# Patient Record
Sex: Female | Born: 1950 | Race: White | Hispanic: No | Smoking: Former smoker
Health system: Southern US, Community
[De-identification: ages and names within clinical notes are randomized; demographics above are authoritative.]

## PROBLEM LIST (undated history)

## (undated) DIAGNOSIS — M810 Age-related osteoporosis without current pathological fracture: Secondary | ICD-10-CM

## (undated) DIAGNOSIS — Z87898 Personal history of other specified conditions: Secondary | ICD-10-CM

## (undated) DIAGNOSIS — F419 Anxiety disorder, unspecified: Secondary | ICD-10-CM

## (undated) DIAGNOSIS — R7989 Other specified abnormal findings of blood chemistry: Secondary | ICD-10-CM

## (undated) DIAGNOSIS — J449 Chronic obstructive pulmonary disease, unspecified: Secondary | ICD-10-CM

## (undated) DIAGNOSIS — B1920 Unspecified viral hepatitis C without hepatic coma: Secondary | ICD-10-CM

## (undated) DIAGNOSIS — R7303 Prediabetes: Secondary | ICD-10-CM

## (undated) DIAGNOSIS — J45909 Unspecified asthma, uncomplicated: Secondary | ICD-10-CM

## (undated) DIAGNOSIS — I6529 Occlusion and stenosis of unspecified carotid artery: Secondary | ICD-10-CM

## (undated) DIAGNOSIS — I2510 Atherosclerotic heart disease of native coronary artery without angina pectoris: Secondary | ICD-10-CM

## (undated) DIAGNOSIS — Z972 Presence of dental prosthetic device (complete) (partial): Secondary | ICD-10-CM

## (undated) DIAGNOSIS — F329 Major depressive disorder, single episode, unspecified: Secondary | ICD-10-CM

## (undated) DIAGNOSIS — Z973 Presence of spectacles and contact lenses: Secondary | ICD-10-CM

## (undated) DIAGNOSIS — E039 Hypothyroidism, unspecified: Secondary | ICD-10-CM

## (undated) DIAGNOSIS — E785 Hyperlipidemia, unspecified: Secondary | ICD-10-CM

## (undated) DIAGNOSIS — M5030 Other cervical disc degeneration, unspecified cervical region: Secondary | ICD-10-CM

## (undated) DIAGNOSIS — I10 Essential (primary) hypertension: Secondary | ICD-10-CM

## (undated) DIAGNOSIS — Z8669 Personal history of other diseases of the nervous system and sense organs: Secondary | ICD-10-CM

## (undated) DIAGNOSIS — F32A Depression, unspecified: Secondary | ICD-10-CM

## (undated) DIAGNOSIS — E539 Vitamin B deficiency, unspecified: Secondary | ICD-10-CM

## (undated) DIAGNOSIS — E079 Disorder of thyroid, unspecified: Secondary | ICD-10-CM

## (undated) DIAGNOSIS — Z8719 Personal history of other diseases of the digestive system: Secondary | ICD-10-CM

## (undated) DIAGNOSIS — R011 Cardiac murmur, unspecified: Secondary | ICD-10-CM

## (undated) DIAGNOSIS — N393 Stress incontinence (female) (male): Secondary | ICD-10-CM

## (undated) HISTORY — PX: UPPER GI ENDOSCOPY: SHX6162

## (undated) HISTORY — PX: CERVICAL FUSION: SHX112

## (undated) HISTORY — PX: OTHER SURGICAL HISTORY: SHX169

## (undated) HISTORY — PX: WISDOM TOOTH EXTRACTION: SHX21

## (undated) HISTORY — PX: TONSILLECTOMY: SUR1361

## (undated) HISTORY — PX: APPENDECTOMY: SHX54

## (undated) HISTORY — PX: ABDOMINAL HYSTERECTOMY: SHX81

## (undated) HISTORY — PX: INCONTINENCE SURGERY: SHX676

## (undated) HISTORY — PX: HERNIA REPAIR: SHX51

## (undated) HISTORY — PX: CHOLECYSTECTOMY: SHX55

## (undated) HISTORY — PX: LAPAROSCOPIC GASTRIC BANDING: SHX1100

## (undated) HISTORY — PX: FOOT SURGERY: SHX648

## (undated) HISTORY — PX: CATARACT EXTRACTION: SUR2

## (undated) HISTORY — PX: COLONOSCOPY: SHX174

## (undated) HISTORY — PX: ANKLE FRACTURE SURGERY: SHX122

## (undated) HISTORY — PX: CERVICAL SPINE SURGERY: SHX589

---
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
## 2012-07-05 DIAGNOSIS — J189 Pneumonia, unspecified organism: Secondary | ICD-10-CM

## 2012-07-05 HISTORY — DX: Pneumonia, unspecified organism: J18.9

## 2017-12-25 ENCOUNTER — Emergency Department (HOSPITAL_BASED_OUTPATIENT_CLINIC_OR_DEPARTMENT_OTHER): Payer: BLUE CROSS/BLUE SHIELD

## 2017-12-25 ENCOUNTER — Encounter (HOSPITAL_BASED_OUTPATIENT_CLINIC_OR_DEPARTMENT_OTHER): Payer: Self-pay

## 2017-12-25 ENCOUNTER — Emergency Department (HOSPITAL_BASED_OUTPATIENT_CLINIC_OR_DEPARTMENT_OTHER)
Admission: EM | Admit: 2017-12-25 | Discharge: 2017-12-25 | Disposition: A | Discharge status: Home / Self Care | Payer: BLUE CROSS/BLUE SHIELD | Attending: Emergency Medicine | Admitting: Emergency Medicine

## 2017-12-25 ENCOUNTER — Other Ambulatory Visit: Payer: Self-pay

## 2017-12-25 DIAGNOSIS — I2510 Atherosclerotic heart disease of native coronary artery without angina pectoris: Secondary | ICD-10-CM | POA: Diagnosis not present

## 2017-12-25 DIAGNOSIS — R011 Cardiac murmur, unspecified: Secondary | ICD-10-CM

## 2017-12-25 DIAGNOSIS — I10 Essential (primary) hypertension: Secondary | ICD-10-CM | POA: Insufficient documentation

## 2017-12-25 DIAGNOSIS — F17200 Nicotine dependence, unspecified, uncomplicated: Secondary | ICD-10-CM | POA: Diagnosis not present

## 2017-12-25 DIAGNOSIS — M7989 Other specified soft tissue disorders: Secondary | ICD-10-CM

## 2017-12-25 DIAGNOSIS — R600 Localized edema: Secondary | ICD-10-CM | POA: Insufficient documentation

## 2017-12-25 DIAGNOSIS — R2241 Localized swelling, mass and lump, right lower limb: Secondary | ICD-10-CM | POA: Diagnosis present

## 2017-12-25 HISTORY — DX: Atherosclerotic heart disease of native coronary artery without angina pectoris: I25.10

## 2017-12-25 HISTORY — DX: Other specified abnormal findings of blood chemistry: R79.89

## 2017-12-25 HISTORY — DX: Vitamin B deficiency, unspecified: E53.9

## 2017-12-25 HISTORY — DX: Unspecified viral hepatitis C without hepatic coma: B19.20

## 2017-12-25 HISTORY — DX: Essential (primary) hypertension: I10

## 2017-12-25 HISTORY — DX: Disorder of thyroid, unspecified: E07.9

## 2017-12-25 LAB — CBC WITH DIFFERENTIAL/PLATELET
Basophils Absolute: 0 10*3/uL (ref 0.0–0.1)
Basophils Relative: 0 %
EOS ABS: 0.2 10*3/uL (ref 0.0–0.7)
Eosinophils Relative: 2 %
HEMATOCRIT: 41.9 % (ref 36.0–46.0)
Hemoglobin: 13.9 g/dL (ref 12.0–15.0)
LYMPHS ABS: 3.8 10*3/uL (ref 0.7–4.0)
LYMPHS PCT: 38 %
MCH: 31.2 pg (ref 26.0–34.0)
MCHC: 33.2 g/dL (ref 30.0–36.0)
MCV: 93.9 fL (ref 78.0–100.0)
Monocytes Absolute: 1 10*3/uL (ref 0.1–1.0)
Monocytes Relative: 10 %
Neutro Abs: 4.9 10*3/uL (ref 1.7–7.7)
Neutrophils Relative %: 50 %
Platelets: 356 10*3/uL (ref 150–400)
RBC: 4.46 MIL/uL (ref 3.87–5.11)
RDW: 13.2 % (ref 11.5–15.5)
WBC: 9.9 10*3/uL (ref 4.0–10.5)

## 2017-12-25 LAB — COMPREHENSIVE METABOLIC PANEL
ALT: 16 U/L (ref 14–54)
AST: 20 U/L (ref 15–41)
Albumin: 4 g/dL (ref 3.5–5.0)
Alkaline Phosphatase: 71 U/L (ref 38–126)
Anion gap: 8 (ref 5–15)
BILIRUBIN TOTAL: 0.3 mg/dL (ref 0.3–1.2)
BUN: 16 mg/dL (ref 6–20)
CALCIUM: 9.3 mg/dL (ref 8.9–10.3)
CO2: 27 mmol/L (ref 22–32)
Chloride: 106 mmol/L (ref 101–111)
Creatinine, Ser: 0.82 mg/dL (ref 0.44–1.00)
GFR calc non Af Amer: >60 mL/min (ref >60)
GLUCOSE: 89 mg/dL (ref 65–99)
Potassium: 3.6 mmol/L (ref 3.5–5.1)
SODIUM: 141 mmol/L (ref 135–145)
Total Protein: 7.4 g/dL (ref 6.5–8.1)

## 2017-12-25 LAB — BRAIN NATRIURETIC PEPTIDE: B Natriuretic Peptide: 44.2 pg/mL (ref 0.0–100.0)

## 2017-12-25 LAB — TROPONIN I

## 2017-12-25 MED ORDER — VARENICLINE TARTRATE 0.5 MG PO TABS: ORAL_TABLET | ORAL | 0 refills | Status: DC

## 2017-12-25 NOTE — ED Triage Notes (Addendum)
Pt reports right leg swelling for 2-3 days. Pt reports yesterday she started developing bruising and pain. Pt denies any injury. Pt reports about 1 year ago she broke her right ankle and had to have surgery. Pt reports she has been weak in that leg since then.

## 2017-12-25 NOTE — ED Notes (Signed)
ED Provider at bedside. 

## 2017-12-25 NOTE — ED Provider Notes (Signed)
Emergency Department Provider Note   I have reviewed the triage vital signs and the nursing notes.   HISTORY  Chief Complaint Leg Swelling (right leg )   HPI Kirsten Patrick is a 67 y.o. female with history of coronary artery disease diagnosed on the CT scan, chronic hepatitis C, thyroid disease, low vitamin D and vitamin B and hypertension on medication presents to the emergency department today with atraumatic right leg swelling.  Patient states she has had a surgery there in the past however over the last couple days she has had swelling and discomfort in that leg along with abnormal bruising without any trauma.  States she never anything like that before on further review of systems she also states that she is had episodic lightheadedness and palpitations.  States compliance her blood pressure but it goes up and down.  No urinary symptoms.  No recent illnesses.  Is meeting up with her doctor on Friday to get a referral for the coronary artery disease that was seen on CT scan was never had a heart attack.  No history of blood clots. No other associated or modifying symptoms.    Past Medical History:  Diagnosis Date  . Coronary artery disease   . Hepatitis C    chronic  . Hypertension   . Low vitamin D level   . Thyroid disease   . Vitamin B deficiency     There are no active problems to display for this patient.   Past Surgical History:  Procedure Laterality Date  . ABDOMINAL HYSTERECTOMY    . ANKLE FRACTURE SURGERY    . APPENDECTOMY    . CERVICAL SPINE SURGERY    . CHOLECYSTECTOMY    . gastric band    . TONSILLECTOMY        Allergies Rocephin [ceftriaxone]  No family history on file.  Social History Social History   Tobacco Use  . Smoking status: Current Every Day Smoker  . Smokeless tobacco: Never Used  Substance Use Topics  . Alcohol use: Never    Frequency: Never  . Drug use: Never    Review of Systems  All other systems negative except as  documented in the HPI. All pertinent positives and negatives as reviewed in the HPI. ____________________________________________   PHYSICAL EXAM:  VITAL SIGNS: ED Triage Vitals  Enc Vitals Group     BP 12/25/17 1624 (!) 184/95     Pulse Rate 12/25/17 1624 86     Resp 12/25/17 1624 20     Temp 12/25/17 1624 99.2 F (37.3 C)     Temp Source 12/25/17 1624 Oral     SpO2 --      Weight 12/25/17 1626 187 lb (84.8 kg)     Height 12/25/17 1626 5\' 4"  (1.626 m)    Constitutional: Alert and oriented. Well appearing and in no acute distress. Eyes: Conjunctivae are normal. PERRL. EOMI. Head: Atraumatic. Nose: No congestion/rhinnorhea. Mouth/Throat: Mucous membranes are moist.  Oropharynx non-erythematous. Neck: No stridor.  No meningeal signs.   Cardiovascular: Normal rate, regular rhythm. Good peripheral circulation. Grossly normal heart sounds.   Respiratory: Normal respiratory effort.  No retractions. Lungs CTAB. Gastrointestinal: Soft and nontender. No distention.  Musculoskeletal: No lower extremity tenderness but has R>L edema (1+ pitting on right) with few areas of ecchymosis and one spot of spider angiomata. No gross deformities of extremities. Neurologic:  Normal speech and language. No gross focal neurologic deficits are appreciated.  Skin:  Skin is warm, dry and  intact. No rash noted.   ____________________________________________   LABS (all labs ordered are listed, but only abnormal results are displayed)  Labs Reviewed  CBC WITH DIFFERENTIAL/PLATELET  COMPREHENSIVE METABOLIC PANEL  BRAIN NATRIURETIC PEPTIDE  TROPONIN I  METANEPHRINES, PLASMA   ____________________________________________  EKG   EKG Interpretation  Date/Time:  Sunday December 25 2017 16:42:54 EDT Ventricular Rate:  74 PR Interval:    QRS Duration: 107 QT Interval:  379 QTC Calculation: 421 R Axis:   59 Text Interpretation:  Sinus rhythm Low voltage, precordial leads No old tracing to compare  Confirmed by Marily MemosMesner, Anan Dapolito [phone]([id number]) on 12/25/2017 5:04:03 PM       ____________________________________________  RADIOLOGY  Dg Chest 2 View  Result Date: 12/25/2017 CLINICAL DATA:  Right leg swelling.  Smoker. EXAM: CHEST - 2 VIEW COMPARISON:  None. FINDINGS: Normal sized heart. Clear lungs. The lungs are mildly hyperexpanded with mild diffuse peribronchial thickening and accentuation of the interstitial markings. A gastric band is in place. Cervical spine fixation hardware. Right neck surgical clips. Thoracic spine degenerative changes and mild scoliosis. IMPRESSION: No acute abnormality.  Mild changes of COPD and chronic bronchitis. Electronically Signed   By: Beckie SaltsSteven  Reid M.D.   On: 12/25/2017 18:14   [place] Venous Img Lower Unilateral Right  Result Date: 12/25/2017 CLINICAL DATA:  Right leg swelling for the past 2-3 days with pain and spontaneous bruising for 1 day. EXAM: RIGHT LOWER EXTREMITY VENOUS DOPPLER ULTRASOUND TECHNIQUE: Gray-scale sonography with graded compression, as well as color Doppler and duplex ultrasound were performed to evaluate the lower extremity deep venous systems from the level of the common femoral vein and including the common femoral, femoral, profunda femoral, popliteal and calf veins including the posterior tibial, peroneal and gastrocnemius veins when visible. The superficial great saphenous vein was also interrogated. Spectral Doppler was utilized to evaluate flow at rest and with distal augmentation maneuvers in the common femoral, femoral and popliteal veins. COMPARISON:  None. FINDINGS: Contralateral Common Femoral Vein: Respiratory phasicity is normal and symmetric with the symptomatic side. No evidence of thrombus. Normal compressibility. Common Femoral Vein: No evidence of thrombus. Normal compressibility, respiratory phasicity and response to augmentation. Saphenofemoral Junction: No evidence of thrombus. Normal compressibility and flow on color Doppler imaging.  Profunda Femoral Vein: No evidence of thrombus. Normal compressibility and flow on color Doppler imaging. Femoral Vein: No evidence of thrombus. Normal compressibility, respiratory phasicity and response to augmentation. Popliteal Vein: No evidence of thrombus. Normal compressibility, respiratory phasicity and response to augmentation. Calf Veins: No evidence of thrombus. Normal compressibility and flow on color Doppler imaging. Superficial Great Saphenous Vein: No evidence of thrombus. Normal compressibility. Venous Reflux:  None. Other Findings:  None. IMPRESSION: No evidence of deep venous thrombosis. Electronically Signed   By: Beckie SaltsSteven  Reid M.D.   On: 12/25/2017 18:15    ____________________________________________   PROCEDURES  Procedure(s) performed:   Procedures   Counseled patient for approximately 9 minutes regarding smoking cessation. Discussed risks of smoking and how they applied and affected their visit here today. Patient ready to quit smoking and elected to try Chantix. Rx provided. Will fu w/ PCP for further prescriptions and monitoring/counseling.   CPT code: [id number]: intermediate counseling for smoking cessation  ____________________________________________   INITIAL IMPRESSION / ASSESSMENT AND PLAN / ED COURSE  Has a heart murmur that she is never heard about before and lower extremity swelling along with hypertension.  Concern for possible heart failure as a cause of her symptoms.  Also could have  pheochromocytoma she has a known adrenal cyst of some sort and is episodic palpitations and syncope.  We will also evaluate for DVT.  Workup negative. Will dc to follow up with PCP for further workup. Also with pcp for murmur and likely need for echo.      Pertinent labs & imaging results that were available during my care of the patient were reviewed by me and considered in my medical decision making (see chart for  details).  ____________________________________________  FINAL CLINICAL IMPRESSION(S) / ED DIAGNOSES  Final diagnoses:  Right leg swelling  Ready to quit smoking  Heart murmur  Hypertension, unspecified type     MEDICATIONS GIVEN DURING THIS VISIT:  Medications - No data to display   NEW OUTPATIENT MEDICATIONS STARTED DURING THIS VISIT:  Discharge Medication List as of 12/25/2017  7:11 PM    START taking these medications   Details  varenicline (CHANTIX) 0.5 MG tablet Please take 1 0.5 mg tablet once daily on days 1 through 3. Please take 1 0.5 mg tablet twice daily on days 4 through 7. After day 7 take 2 0.5 mg tablets twice daily until finished with treatment. Follow-up with your primary doctor to get a prescript ion for continuing treatment., Print        Note:  This note was prepared with assistance of Dragon voice recognition software. Occasional wrong-word or sound-a-like substitutions may have occurred due to the inherent limitations of voice recognition software.   Marily Memos, MD 12/25/17 2231

## 2017-12-25 NOTE — Discharge Instructions (Addendum)
Your blood pressure was significantly elevated on arrival here today.  I know that you take medications at home we will suggest that you check your blood pressures 2 or 3 times a day over the next week.  Do not obsess or worry over the numbers you get but take them into your doctor on Friday and see if you need any blood pressure adjustments.  Try to take them randomly throughout the day so that your doctor can have a good picture of what your blood pressure does throughout the day to help with treatment.  I have given you a prescription for Chantix.  I have included the directions that are suggested however there may be an alternative regimen so if pharmacy suggested a different regimen please utilize that.

## 2017-12-25 NOTE — ED Notes (Signed)
Patient transported to Ultrasound 

## 2017-12-28 LAB — METANEPHRINES, PLASMA
METANEPHRINE FREE: 35 pg/mL (ref 0–62)
NORMETANEPHRINE FREE: 77 pg/mL (ref 0–145)

## 2018-09-03 HISTORY — PX: CAROTID ENDARTERECTOMY: SUR193

## 2019-06-04 ENCOUNTER — Other Ambulatory Visit (HOSPITAL_COMMUNITY)
Admission: RE | Admit: 2019-06-04 | Discharge: 2019-06-04 | Disposition: A | Discharge status: Home / Self Care | Payer: BC Managed Care – PPO | Source: Ambulatory Visit | Attending: Orthopedic Surgery | Admitting: Orthopedic Surgery

## 2019-06-04 ENCOUNTER — Other Ambulatory Visit: Payer: Self-pay

## 2019-06-04 ENCOUNTER — Encounter (HOSPITAL_COMMUNITY): Payer: Self-pay

## 2019-06-04 DIAGNOSIS — Z20828 Contact with and (suspected) exposure to other viral communicable diseases: Secondary | ICD-10-CM | POA: Insufficient documentation

## 2019-06-04 DIAGNOSIS — Z01812 Encounter for preprocedural laboratory examination: Secondary | ICD-10-CM | POA: Insufficient documentation

## 2019-06-04 LAB — SARS CORONAVIRUS 2 (TAT 6-24 HRS): SARS Coronavirus 2: NEGATIVE

## 2019-06-04 NOTE — Progress Notes (Signed)
PCP - Dr. Kyra Manges last office visit 05/2019 Cardiologist - N/A Vascular: Dr. Rosana Hoes 10/2018 and L. Tastad 05/2019  Chest x-ray - 01/02/2019 care everywhere EKG - N/A Stress Test - 03/2018 care everywhere ECHO - 03/2018 care everywhere Cardiac Cath - greater than 2 years  Sleep Study - 10-15 years ago CPAP - No longer uses  Fasting Blood Sugar - N/A Checks Blood Sugar _N/A____ times a day  Blood Thinner Instructions: N/A Aspirin Instructions: N/A Last Dose: N/A  Anesthesia review: Carotid Stenosis S/P CEA 09/2018, History of OSA no longer uses CPAP, HTN, Prediabetic  Patient denies shortness of breath, fever, cough and chest pain at PAT appointment   Patient verbalized understanding of instructions that were given to them at the PAT appointment. Patient was also instructed that they will need to review over the PAT instructions again at home before surgery.

## 2019-06-04 NOTE — Progress Notes (Signed)
Anesthesia Chart Review   Case: [id number] Date/Time: 06/05/19 1206   Procedure: Left shoulder arthroscopy, subacromial decompression,possible rotator cuff repair (Left ) - General with exparel  Would like to go at 12:00pm   Anesthesia type: General   Pre-op diagnosis: Left shoulder impingement acromioclavicular osteoarthritis and partial rotator cuff tear   Location: [facility] ROOM 06 / [facility]   Surgeon: Justice Britain, MD      DISCUSSION:68 y.o. former smoker (8.75 pack years) with h/o HTN, asthma, hyporthyroidism, HLD, COPD, sleep apnea w/o device use, carotid artery stenosis s/p endarterectomy 09/2018, nonobstructive CAD, left shoulder impingement and ac OA scheduled for above procedure 06/05/2019 with Dr. Justice Britain.   Seen by cardiologist, Dr. Bishop Limbo, 10/30/2018.  Per OV note, "This patient had a heart catheterization in 2008 with Dr. Noreene Filbert that showed nonobstructive CAD.  She had a CT scan showing some coronary artery calcifications ans was referred to Dr. Donnetta Hutching.  She underwent a stress test showing no evidence of ischemia.  She is continuing to do fine with no chest pain or any other symptoms of angina.  We discussed primary prevention. "  Follow up prn recommended.   S/p right carotid endarterectomy 09/27/2018.  Last follow up with vascular surgeon 05/10/2019.  Stable at this visit with repeat carotid duplex scheduled in 6 months.    Anticipate pt can proceed with planned procedure barring acute status change and after evaluation DOS (SDW).  VS: Ht 5\' 4"  (1.626 m)   Wt 83.9 kg   BMI 31.76 kg/m   PROVIDERS: Garlan Fillers, MD is PCP last seen 05/25/2019  Concha Se, MD is Vascular Surgeon  Dolan Amen, MD is Cardiologist  LABS: SDW (all labs ordered are listed, but only abnormal results are displayed)  Labs Reviewed - No data to display   IMAGES:   EKG:   CV: Stress Echo 03/24/18 Stress Interpretation  Normal resting left ventricular function.  Normal hypercontractile response throughout.  Appropriate hemodynamic response to exercise.  Results  Global LVEF (rest): Normal (LVEF >50%)  Global LVEF (stress): Hyperkinetic (LVEF >70%)  ECG  No ST-T wave changes.  Arrhythmias  Atrial premature beats.  Symptoms  Fatigue.  Symptoms resolved with rest. Summary Functional capacity is poor for age/sex - 4 mets on the 2-min Bruce protocol Normal resting biventricular function (ejection fraction), with no resting segmental abnormality. No clinical or echocardiographic ischemia (induced wall motion abnormality): Negative stress echocardiogram Past Medical History:  Diagnosis Date  . Anxiety   . Asthma   . Carotid artery stenosis    Right  . COPD (chronic obstructive pulmonary disease) (HCC)    Mild  . Coronary artery disease   . DDD (degenerative disc disease), cervical   . Depression   . Heart murmur   . Hepatitis C    chronic, viral 2015  . History of blood transfusion 1973  . History of hiatal hernia   . History of sleep apnea    No longer uses CPAP machine  . History of vertigo   . Hyperlipidemia   . Hypertension   . Hypothyroidism   . Low vitamin D level   . Osteoporosis   . Pneumonia 2014  . Pre-diabetes   . SUI (stress urinary incontinence, female)    History of  . Vitamin B deficiency   . Wears dentures    upper  . Wears glasses    Reading  . Wears partial dentures    Lower    Past Surgical  History:  Procedure Laterality Date  . ABDOMINAL HYSTERECTOMY    . ANKLE FRACTURE SURGERY Right   . APPENDECTOMY    . CAROTID ENDARTERECTOMY Right 09/2018   Dr. Raynelle Fanning  . CERVICAL FUSION    . CHOLECYSTECTOMY    . COLONOSCOPY    . FOOT SURGERY Left   . HERNIA REPAIR    . INCONTINENCE SURGERY    . LAPAROSCOPIC GASTRIC BANDING    . TONSILLECTOMY    . UPPER GI ENDOSCOPY    . UPPER GI ENDOSCOPY    . WISDOM TOOTH EXTRACTION      MEDICATIONS: No current facility-administered medications for this  encounter.    Marland Kitchen acetaminophen (TYLENOL) 500 MG tablet  . albuterol (VENTOLIN HFA) 108 (90 Base) MCG/ACT inhaler  . amLODipine (NORVASC) 5 MG tablet  . atorvastatin (LIPITOR) 40 MG tablet  . buPROPion (WELLBUTRIN SR) 150 MG 12 hr tablet  . Cholecalciferol (VITAMIN D3 PO)  . Cyanocobalamin (B-12 PO)  . DULoxetine (CYMBALTA) 60 MG capsule  . levothyroxine (SYNTHROID, LEVOTHROID) 175 MCG tablet  . losartan-hydrochlorothiazide (HYZAAR) 100-12.5 MG tablet  . omeprazole (PRILOSEC) 20 MG capsule     Janey Genta [facility] Pre-Surgical Testing [phone] 06/04/19  2:44 PM

## 2019-06-05 ENCOUNTER — Ambulatory Visit (HOSPITAL_COMMUNITY): Payer: BC Managed Care – PPO | Admitting: Physician Assistant

## 2019-06-05 ENCOUNTER — Encounter (HOSPITAL_COMMUNITY): Payer: Self-pay | Admitting: General Practice

## 2019-06-05 ENCOUNTER — Encounter (HOSPITAL_COMMUNITY)
Admission: RE | Disposition: A | Discharge status: Home / Self Care | Payer: Self-pay | Source: Home / Self Care | Attending: Orthopedic Surgery

## 2019-06-05 ENCOUNTER — Ambulatory Visit (HOSPITAL_COMMUNITY)
Admission: RE | Admit: 2019-06-05 | Discharge: 2019-06-05 | Disposition: A | Discharge status: Home / Self Care | Payer: BC Managed Care – PPO | Attending: Orthopedic Surgery | Admitting: Orthopedic Surgery

## 2019-06-05 DIAGNOSIS — I2510 Atherosclerotic heart disease of native coronary artery without angina pectoris: Secondary | ICD-10-CM | POA: Diagnosis not present

## 2019-06-05 DIAGNOSIS — M25812 Other specified joint disorders, left shoulder: Secondary | ICD-10-CM | POA: Insufficient documentation

## 2019-06-05 DIAGNOSIS — Z87891 Personal history of nicotine dependence: Secondary | ICD-10-CM | POA: Insufficient documentation

## 2019-06-05 DIAGNOSIS — M19012 Primary osteoarthritis, left shoulder: Secondary | ICD-10-CM | POA: Diagnosis present

## 2019-06-05 DIAGNOSIS — F329 Major depressive disorder, single episode, unspecified: Secondary | ICD-10-CM | POA: Diagnosis not present

## 2019-06-05 DIAGNOSIS — Z9884 Bariatric surgery status: Secondary | ICD-10-CM | POA: Diagnosis not present

## 2019-06-05 DIAGNOSIS — Z79899 Other long term (current) drug therapy: Secondary | ICD-10-CM | POA: Diagnosis not present

## 2019-06-05 DIAGNOSIS — M75102 Unspecified rotator cuff tear or rupture of left shoulder, not specified as traumatic: Secondary | ICD-10-CM | POA: Diagnosis not present

## 2019-06-05 DIAGNOSIS — E039 Hypothyroidism, unspecified: Secondary | ICD-10-CM | POA: Insufficient documentation

## 2019-06-05 DIAGNOSIS — J449 Chronic obstructive pulmonary disease, unspecified: Secondary | ICD-10-CM | POA: Insufficient documentation

## 2019-06-05 DIAGNOSIS — I10 Essential (primary) hypertension: Secondary | ICD-10-CM | POA: Diagnosis not present

## 2019-06-05 DIAGNOSIS — F419 Anxiety disorder, unspecified: Secondary | ICD-10-CM | POA: Diagnosis not present

## 2019-06-05 DIAGNOSIS — Z79890 Hormone replacement therapy (postmenopausal): Secondary | ICD-10-CM | POA: Diagnosis not present

## 2019-06-05 DIAGNOSIS — E785 Hyperlipidemia, unspecified: Secondary | ICD-10-CM | POA: Insufficient documentation

## 2019-06-05 HISTORY — DX: Unspecified asthma, uncomplicated: J45.909

## 2019-06-05 HISTORY — DX: Prediabetes: R73.03

## 2019-06-05 HISTORY — DX: Anxiety disorder, unspecified: F41.9

## 2019-06-05 HISTORY — DX: Hypothyroidism, unspecified: E03.9

## 2019-06-05 HISTORY — DX: Personal history of other specified conditions: Z87.898

## 2019-06-05 HISTORY — DX: Occlusion and stenosis of unspecified carotid artery: I65.29

## 2019-06-05 HISTORY — DX: Other cervical disc degeneration, unspecified cervical region: M50.30

## 2019-06-05 HISTORY — DX: Depression, unspecified: F32.A

## 2019-06-05 HISTORY — PX: SHOULDER ARTHROSCOPY WITH ROTATOR CUFF REPAIR AND SUBACROMIAL DECOMPRESSION: SHX5686

## 2019-06-05 HISTORY — DX: Cardiac murmur, unspecified: R01.1

## 2019-06-05 HISTORY — DX: Presence of spectacles and contact lenses: Z97.3

## 2019-06-05 HISTORY — DX: Age-related osteoporosis without current pathological fracture: M81.0

## 2019-06-05 HISTORY — DX: Stress incontinence (female) (male): N39.3

## 2019-06-05 HISTORY — DX: Presence of dental prosthetic device (complete) (partial): Z97.2

## 2019-06-05 HISTORY — DX: Personal history of other diseases of the nervous system and sense organs: Z86.69

## 2019-06-05 HISTORY — DX: Hyperlipidemia, unspecified: E78.5

## 2019-06-05 HISTORY — DX: Chronic obstructive pulmonary disease, unspecified: J44.9

## 2019-06-05 HISTORY — DX: Personal history of other diseases of the digestive system: Z87.19

## 2019-06-05 LAB — HEMOGLOBIN A1C
Hgb A1c MFr Bld: 5.4 % (ref 4.8–5.6)
Mean Plasma Glucose: 108.28 mg/dL

## 2019-06-05 SURGERY — SHOULDER ARTHROSCOPY WITH ROTATOR CUFF REPAIR AND SUBACROMIAL DECOMPRESSION
Anesthesia: General | Site: Shoulder | Laterality: Left

## 2019-06-05 MED ORDER — OXYCODONE-ACETAMINOPHEN 5-325 MG PO TABS: 1.0000 | ORAL_TABLET | ORAL | 0 refills | Status: AC | PRN

## 2019-06-05 MED ORDER — LIDOCAINE 2% (20 MG/ML) 5 ML SYRINGE
INTRAMUSCULAR | Status: AC
  Filled 2019-06-05: qty 5

## 2019-06-05 MED ORDER — ROCURONIUM BROMIDE 10 MG/ML (PF) SYRINGE
PREFILLED_SYRINGE | INTRAVENOUS | Status: AC
  Filled 2019-06-05: qty 10

## 2019-06-05 MED ORDER — PHENYLEPHRINE HCL (PRESSORS) 10 MG/ML IV SOLN
INTRAVENOUS | Status: AC
  Filled 2019-06-05: qty 1

## 2019-06-05 MED ORDER — PHENYLEPHRINE 40 MCG/ML (10ML) SYRINGE FOR IV PUSH (FOR BLOOD PRESSURE SUPPORT)
PREFILLED_SYRINGE | INTRAVENOUS | Status: DC | PRN
  Administered 2019-06-05 (×5): 80 ug via INTRAVENOUS

## 2019-06-05 MED ORDER — PROPOFOL 10 MG/ML IV BOLUS
INTRAVENOUS | Status: AC
  Filled 2019-06-05: qty 20

## 2019-06-05 MED ORDER — ROCURONIUM BROMIDE 10 MG/ML (PF) SYRINGE
PREFILLED_SYRINGE | INTRAVENOUS | Status: DC | PRN
  Administered 2019-06-05: 40 mg via INTRAVENOUS

## 2019-06-05 MED ORDER — FENTANYL CITRATE (PF) 250 MCG/5ML IJ SOLN
INTRAMUSCULAR | Status: DC | PRN
  Administered 2019-06-05 (×2): 50 ug via INTRAVENOUS

## 2019-06-05 MED ORDER — SODIUM CHLORIDE 0.9 % IR SOLN
Status: DC | PRN
  Administered 2019-06-05: 12000 mL
  Administered 2019-06-05: 6000 mL

## 2019-06-05 MED ORDER — LACTATED RINGERS IV SOLN
INTRAVENOUS | Status: DC
  Administered 2019-06-05 (×2): via INTRAVENOUS

## 2019-06-05 MED ORDER — CYCLOBENZAPRINE HCL 10 MG PO TABS: 10.0000 mg | ORAL_TABLET | Freq: Three times a day (TID) | ORAL | 1 refills | Status: AC | PRN

## 2019-06-05 MED ORDER — OXYCODONE HCL 5 MG/5ML PO SOLN: 5.0000 mg | Freq: Once | ORAL | Status: DC | PRN

## 2019-06-05 MED ORDER — FENTANYL CITRATE (PF) 100 MCG/2ML IJ SOLN: 25.0000 ug | INTRAMUSCULAR | Status: DC | PRN

## 2019-06-05 MED ORDER — CLINDAMYCIN PHOSPHATE 900 MG/50ML IV SOLN
900.0000 mg | INTRAVENOUS | Status: AC
  Administered 2019-06-05: 900 mg via INTRAVENOUS
  Filled 2019-06-05: qty 50

## 2019-06-05 MED ORDER — NAPROXEN 500 MG PO TABS: 500.0000 mg | ORAL_TABLET | Freq: Two times a day (BID) | ORAL | 1 refills | Status: AC

## 2019-06-05 MED ORDER — ONDANSETRON HCL 4 MG/2ML IJ SOLN: 4.0000 mg | Freq: Four times a day (QID) | INTRAMUSCULAR | Status: DC | PRN

## 2019-06-05 MED ORDER — MIDAZOLAM HCL 2 MG/2ML IJ SOLN
1.0000 mg | INTRAMUSCULAR | Status: DC
  Filled 2019-06-05: qty 2

## 2019-06-05 MED ORDER — DEXAMETHASONE SODIUM PHOSPHATE 10 MG/ML IJ SOLN
INTRAMUSCULAR | Status: DC | PRN
  Administered 2019-06-05: 10 mg via INTRAVENOUS

## 2019-06-05 MED ORDER — EPHEDRINE SULFATE-NACL 50-0.9 MG/10ML-% IV SOSY
PREFILLED_SYRINGE | INTRAVENOUS | Status: DC | PRN
  Administered 2019-06-05: 10 mg via INTRAVENOUS

## 2019-06-05 MED ORDER — PROPOFOL 10 MG/ML IV BOLUS
INTRAVENOUS | Status: DC | PRN
  Administered 2019-06-05: 50 mg via INTRAVENOUS
  Administered 2019-06-05: 150 mg via INTRAVENOUS

## 2019-06-05 MED ORDER — BUPIVACAINE HCL (PF) 0.5 % IJ SOLN
INTRAMUSCULAR | Status: DC | PRN
  Administered 2019-06-05: 15 mL via PERINEURAL

## 2019-06-05 MED ORDER — BUPIVACAINE LIPOSOME 1.3 % IJ SUSP
INTRAMUSCULAR | Status: DC | PRN
  Administered 2019-06-05: 10 mL via PERINEURAL

## 2019-06-05 MED ORDER — PHENYLEPHRINE HCL-NACL 10-0.9 MG/250ML-% IV SOLN
INTRAVENOUS | Status: DC | PRN
  Administered 2019-06-05: 35 ug/min via INTRAVENOUS

## 2019-06-05 MED ORDER — DEXAMETHASONE SODIUM PHOSPHATE 10 MG/ML IJ SOLN
INTRAMUSCULAR | Status: AC
  Filled 2019-06-05: qty 1

## 2019-06-05 MED ORDER — FENTANYL CITRATE (PF) 100 MCG/2ML IJ SOLN
50.0000 ug | INTRAMUSCULAR | Status: DC
  Administered 2019-06-05: 12:00:00 50 ug via INTRAVENOUS
  Filled 2019-06-05: qty 2

## 2019-06-05 MED ORDER — CHLORHEXIDINE GLUCONATE 4 % EX LIQD: 60.0000 mL | Freq: Once | CUTANEOUS | Status: DC

## 2019-06-05 MED ORDER — LACTATED RINGERS IR SOLN: Status: DC | PRN

## 2019-06-05 MED ORDER — ONDANSETRON HCL 4 MG PO TABS: 4.0000 mg | ORAL_TABLET | Freq: Three times a day (TID) | ORAL | 0 refills | Status: AC | PRN

## 2019-06-05 MED ORDER — ONDANSETRON HCL 4 MG/2ML IJ SOLN
INTRAMUSCULAR | Status: DC | PRN
  Administered 2019-06-05: 4 mg via INTRAVENOUS

## 2019-06-05 MED ORDER — SUCCINYLCHOLINE CHLORIDE 200 MG/10ML IV SOSY
PREFILLED_SYRINGE | INTRAVENOUS | Status: DC | PRN
  Administered 2019-06-05: 100 mg via INTRAVENOUS

## 2019-06-05 MED ORDER — ONDANSETRON HCL 4 MG/2ML IJ SOLN
INTRAMUSCULAR | Status: AC
  Filled 2019-06-05: qty 2

## 2019-06-05 MED ORDER — SUGAMMADEX SODIUM 200 MG/2ML IV SOLN
INTRAVENOUS | Status: DC | PRN
  Administered 2019-06-05: 200 mg via INTRAVENOUS

## 2019-06-05 MED ORDER — LIDOCAINE 2% (20 MG/ML) 5 ML SYRINGE
INTRAMUSCULAR | Status: DC | PRN
  Administered 2019-06-05: 60 mg via INTRAVENOUS

## 2019-06-05 MED ORDER — FENTANYL CITRATE (PF) 100 MCG/2ML IJ SOLN
INTRAMUSCULAR | Status: AC
  Filled 2019-06-05: qty 2

## 2019-06-05 MED ORDER — OXYCODONE HCL 5 MG PO TABS: 5.0000 mg | ORAL_TABLET | Freq: Once | ORAL | Status: DC | PRN

## 2019-06-05 SURGICAL SUPPLY — 60 items
ANCHOR PEEK 4.75X19.1 SWLK C (Anchor) ×9 IMPLANT
ANCHOR SUT SWIVELLOK BIO (Anchor) ×3 IMPLANT
BLADE EXCALIBUR 4.0MM X 13CM (MISCELLANEOUS) ×1
BLADE EXCALIBUR 4.0X13 (MISCELLANEOUS) ×2 IMPLANT
BOOTIES KNEE HIGH SLOAN (MISCELLANEOUS) ×6 IMPLANT
BURR OVAL 8 FLU 4.0MM X 13CM (MISCELLANEOUS) ×1
BURR OVAL 8 FLU 4.0X13 (MISCELLANEOUS) ×2 IMPLANT
CANNULA ACUFLEX KIT 5X76 (CANNULA) ×3 IMPLANT
CANNULA DRILOCK 5.0MMX75MM (CANNULA) ×1
CANNULA DRILOCK 5.0X75 (CANNULA) ×2 IMPLANT
CANNULA TWIST IN 8.25X7CM (CANNULA) ×3 IMPLANT
CLOSURE WOUND 1/2 X4 (GAUZE/BANDAGES/DRESSINGS) ×1
CONNECTOR 5 IN 1 STRAIGHT STRL (MISCELLANEOUS) ×3 IMPLANT
COOLER ICEMAN CLASSIC (MISCELLANEOUS) IMPLANT
COVER WAND RF STERILE (DRAPES) ×3 IMPLANT
DISSECTOR  3.8MM X 13CM (MISCELLANEOUS) ×2
DISSECTOR 3.8MM X 13CM (MISCELLANEOUS) ×1 IMPLANT
DRAPE INCISE 23X17 IOBAN STRL (DRAPES) ×2
DRAPE INCISE IOBAN 23X17 STRL (DRAPES) ×1 IMPLANT
DRAPE INCISE IOBAN 66X45 STRL (DRAPES) ×3 IMPLANT
DRAPE ORTHO SPLIT 77X108 STRL (DRAPES) ×4
DRAPE STERI 35X30 U-POUCH (DRAPES) ×3 IMPLANT
DRAPE SURG 17X11 SM STRL (DRAPES) ×3 IMPLANT
DRAPE SURG ORHT 6 SPLT 77X108 (DRAPES) ×2 IMPLANT
DRAPE U-SHAPE 47X51 STRL (DRAPES) ×3 IMPLANT
DRSG PAD ABDOMINAL 8X10 ST (GAUZE/BANDAGES/DRESSINGS) ×3 IMPLANT
DURAPREP 26ML APPLICATOR (WOUND CARE) ×6 IMPLANT
GAUZE SPONGE 4X4 12PLY STRL (GAUZE/BANDAGES/DRESSINGS) ×3 IMPLANT
GLOVE BIO SURGEON STRL SZ7.5 (GLOVE) ×3 IMPLANT
GLOVE BIO SURGEON STRL SZ8 (GLOVE) ×3 IMPLANT
GLOVE SS BIOGEL STRL SZ 7 (GLOVE) ×2 IMPLANT
GLOVE SS BIOGEL STRL SZ 7.5 (GLOVE) ×1 IMPLANT
GLOVE SUPERSENSE BIOGEL SZ 7 (GLOVE) ×4
GLOVE SUPERSENSE BIOGEL SZ 7.5 (GLOVE) ×2
GOWN STRL REUS W/TWL LRG LVL3 (GOWN DISPOSABLE) ×6 IMPLANT
KIT BASIN OR (CUSTOM PROCEDURE TRAY) ×3 IMPLANT
KIT SHOULDER TRACTION (DRAPES) ×3 IMPLANT
KIT TURNOVER KIT A (KITS) IMPLANT
MANIFOLD NEPTUNE II (INSTRUMENTS) ×3 IMPLANT
NEEDLE SCORPION MULTI FIRE (NEEDLE) ×3 IMPLANT
NS IRRIG 1000ML POUR BTL (IV SOLUTION) ×3 IMPLANT
PACK ARTHROSCOPY DSU (CUSTOM PROCEDURE TRAY) ×3 IMPLANT
PAD ARMBOARD 7.5X6 YLW CONV (MISCELLANEOUS) ×3 IMPLANT
PAD COLD SHLDR WRAP-ON (PAD) IMPLANT
PENCIL SMOKE EVACUATOR (MISCELLANEOUS) IMPLANT
PROBE APOLLO 90XL (SURGICAL WAND) ×3 IMPLANT
SLING ARM FOAM STRAP MED (SOFTGOODS) IMPLANT
SLING ULTRA III MED (ORTHOPEDIC SUPPLIES) ×3 IMPLANT
SPONGE LAP 4X18 RFD (DISPOSABLE) ×3 IMPLANT
STRIP CLOSURE SKIN 1/2X4 (GAUZE/BANDAGES/DRESSINGS) ×2 IMPLANT
SUT FIBERWIRE #2 38 T-5 BLUE (SUTURE)
SUT MNCRL AB 3-0 PS2 18 (SUTURE) ×3 IMPLANT
SUT PDS AB 0 CT 36 (SUTURE) IMPLANT
SUT TIGER TAPE 7 IN WHITE (SUTURE) ×3 IMPLANT
SUTURE FIBERWR #2 38 T-5 BLUE (SUTURE) IMPLANT
TAPE FIBER 2MM 7IN #2 BLUE (SUTURE) ×3 IMPLANT
TAPE PAPER 3X10 WHT MICROPORE (GAUZE/BANDAGES/DRESSINGS) ×3 IMPLANT
TOWEL OR 17X26 10 PK STRL BLUE (TOWEL DISPOSABLE) ×3 IMPLANT
TUBING ARTHROSCOPY IRRIG 16FT (MISCELLANEOUS) ×3 IMPLANT
WATER STERILE IRR 1000ML POUR (IV SOLUTION) ×3 IMPLANT

## 2019-06-05 NOTE — Op Note (Signed)
06/05/2019  2:36 PM  PATIENT:   Jaquana Geiger  68 y.o. female  PRE-OPERATIVE DIAGNOSIS:  Left shoulder impingement acromioclavicular osteoarthritis and partial rotator cuff tear  POST-OPERATIVE DIAGNOSIS: Same with additional finding of degenerative labral tear  PROCEDURE:  1.  Left shoulder examination under anesthesia.  2.  Left shoulder glenohumeral joint diagnostic arthroscopy.  3.  Debridement of degenerative labral tear  4.  Arthroscopic subacromial decompression and bursectomy  5.  Arthroscopic distal clavicle resection  6.  Arthroscopic rotator cuff repair using a double row suture bridge repair construct  SURGEON:  Chizaram Latino, Metta Clines M.D.  ASSISTANTS: Jenetta Loges, PA-C  ANESTHESIA:   General endotracheal and interscalene block with Exparel  EBL: Minimal  SPECIMEN: None  Drains: None   PATIENT DISPOSITION:  PACU - hemodynamically stable.    PLAN OF CARE: Discharge to home after PACU  Brief history:  Ms. Grandpre is a 68 year old female who has had chronic and progressively increasing left shoulder pain which is been refractory to prolonged attempts at conservative management.  Her preoperative MRI scan shows evidence for bony impingement as well as partial rotator cuff tear and AC joint arthritis.  Due to her ongoing pain and functional mutations and failure to respond to conservative management she is brought to the operating this time for planned left shoulder arthroscopy as described below  Preoperatively I counseled Ms. Simona Huh regarding treatment options as well as the potential risks versus benefits thereof.  Possible surgical complications were reviewed including bleeding, infection, neurovascular injury, persistent pain, loss of motion, anesthetic complication, recurrent rotator cuff tear, and possible need for additional surgery.  She understands, and accepts, and agrees with the planned procedure.  Procedure in detail:  After undergoing routine preop  evaluation patient received prophylactic antibiotics and interscalene block with Exparel established in the holding area by the anesthesia department.  Placed spine on the operating table and underwent smooth duction of a general endotracheal anesthesia.  Turned to the right lateral decubitus position on the beanbag and appropriately padded and protected.  Left shoulder examination under anesthesia revealed full motion with no instability patterns.  Left arm was then suspended at 70 degrees of abduction with 15 pounds of traction in the left shoulder girdle region was sterilely prepped and draped in standard fashion.  Timeout was called.  A posterior portal established into the glenohumeral joint anterior portal established under direct visualization.  The articular surfaces were found to be in good condition.  There was degenerative labral tearing anteriorly and superiorly which was debrided with a shaver to a stable margin.  The bicep tendon showed normal caliber with no evidence for proximal or distal instability.  Rotator cuff showed a significant partial articular sided tear which was debrided with a shaver and when viewed from the articular side this appeared to account for at least 50 to 60% of the tendon thickness.  A PDS suture was passed for visualization of the bursal side.  The balance of the glenoid joint did show some mild mild synovitis but no additional pathologies were identified.  Fluid and instruments removed.  Arm was then dropped down to 30 degrees of abduction with the arthroscope introduced in the subacromial space of the posterior portal and a direct lateral portal established in the subacromial space.  Abundant dense bursal tissue multiple adhesions were encountered and these were all divided and excised, the shaver and Stryker wand.  The wand was then used to remove the periosteum from the undersurface of the anterior half  of the acromion then a subacromial decompression was performed with  a bur creating a type I morphology.  A portal was established directly anterior to the distal clavicle and the distal clavicle resection was performed with a bur removing approximately 8 mm of bone.  Care was taken to confirm visualization of the entire circumference of the distal clavicle to ensure adequate removal of bone.  This point we then completed the subacromial/subdeltoid bursectomy.  The rotator cuff was then inspected and probed and the region surrounding tag suture did indeed identify significant attenuation of the residual portion of the tendon such that a probe easily passed through this thin veil of tissue and with this we completed the rotator cuff tear creating full-thickness defect that ultimately was approximately 2 cm in width.  The rotator cuff tendon was debrided back to healthy tissue and the tuberosity was then prepared removing soft tissue and then abrading the bone to bleeding bed.  Through a stab wound off the lateral margin of the acromion placed an Arthrex peek corkscrew suture anchor loaded with a fiber tape and the 4 suture limbs were then shuttled equidistant across the width of the rotator cuff tear using the scorpion suture passer and then 2 lateral anchors were placed creating a double row repair.  I should note that the quality of the bone for the lateral anchors was somewhat osteoporotic and the posterior lateral row anchor we actually used a 625 anchor to obtain appropriate bony purchase.  Overall however the construct is much to our satisfaction.  Suture limbs were all appropriately clipped.  Final hemostasis was obtained.  Fluid and instruments removed.  The portals were closed with Monocryl and a Steri-Strip.  A dry dressing taped about the left shoulder left arm was placed in sling immobilizer and the patient was awakened, extubated, taken permit examination.  Ralene Bathe, PA-C was used as an Geophysicist/field seismologist throughout this case essential for help with positioning patient,  positioning extremity, tissue manipulation, suture management wound closure and intraoperative decision-making.  Vania Rea Duane Earnshaw MD   Contact # [phone]

## 2019-06-05 NOTE — Anesthesia Procedure Notes (Signed)
Procedure Name: Intubation Date/Time: 06/05/2019 1:02 PM Performed by: Talbot Grumbling, CRNA Pre-anesthesia Checklist: Patient identified, Suction available, Patient being monitored and Emergency Drugs available Patient Re-evaluated:Patient Re-evaluated prior to induction Oxygen Delivery Method: Circle system utilized Preoxygenation: Pre-oxygenation with 100% oxygen Induction Type: IV induction Ventilation: Mask ventilation without difficulty Laryngoscope Size: Mac and 3 Grade View: Grade II Tube type: Oral Tube size: 7.0 mm Number of attempts: 1 Airway Equipment and Method: Stylet Placement Confirmation: ETT inserted through vocal cords under direct vision,  positive ETCO2 and breath sounds checked- equal and bilateral Secured at: 21 cm Tube secured with: Tape Dental Injury: Teeth and Oropharynx as per pre-operative assessment

## 2019-06-05 NOTE — Progress Notes (Signed)
Assisted Dr. Quita Skye hodierne with Left Shoulder block. Side rails up, monitors on throughout procedure. See vital signs in flow sheet. Tolerated Procedure well.

## 2019-06-05 NOTE — Anesthesia Postprocedure Evaluation (Signed)
Anesthesia Post Note  Patient: Kirsten Patrick  Procedure(s) Performed: Left shoulder arthroscopy, subacromial decompression,rotator cuff repair (Left Shoulder)     Patient location during evaluation: PACU Anesthesia Type: General and Regional Level of consciousness: awake and alert, oriented and patient cooperative Pain management: pain level controlled Vital Signs Assessment: post-procedure vital signs reviewed and stable Respiratory status: spontaneous breathing, nonlabored ventilation and respiratory function stable Cardiovascular status: blood pressure returned to baseline and stable Postop Assessment: no apparent nausea or vomiting Anesthetic complications: no Comments: Baseline SBP 160    Last Vitals:  Vitals:   06/05/19 1223 06/05/19 1228  BP: (!) 149/79 (!) 156/74  Pulse: (!) 58 64  Resp: 12 18  Temp:    SpO2: 98% 97%    Last Pain:  Vitals:   06/05/19 1223  TempSrc:   PainSc: 0-No pain                 Pervis Hocking

## 2019-06-05 NOTE — Anesthesia Procedure Notes (Signed)
Anesthesia Regional Block: Interscalene brachial plexus block   Pre-Anesthetic Checklist: ,, timeout performed, Correct Patient, Correct Site, Correct Laterality, Correct Procedure, Correct Position, site marked, Risks and benefits discussed,  Surgical consent,  Pre-op evaluation,  At surgeon's request and post-op pain management  Laterality: Left  Prep: chloraprep       Needles:  Injection technique: Single-shot  Needle Type: Echogenic Stimulator Needle     Needle Length: 5cm  Needle Gauge: 22     Additional Needles:   Procedures:, nerve stimulator,,,,,,,   Nerve Stimulator or Paresthesia:  Response: biceps flexion, 0.45 mA,   Additional Responses:   Narrative:  Start time: 06/05/2019 11:51 AM End time: 06/05/2019 11:59 AM Injection made incrementally with aspirations every 5 mL.  Performed by: Personally  Anesthesiologist: Albertha Ghee, MD  Additional Notes: Functioning IV was confirmed and monitors were applied.  A 70mm 22ga Arrow echogenic stimulator needle was used. Sterile prep and drape,hand hygiene and sterile gloves were used.  Negative aspiration and negative test dose prior to incremental administration of local anesthetic. The patient tolerated the procedure well.  Ultrasound guidance: relevent anatomy identified, needle position confirmed, local anesthetic spread visualized around nerve(s), vascular puncture avoided.  Image printed for medical record.

## 2019-06-05 NOTE — Anesthesia Preprocedure Evaluation (Signed)
Anesthesia Evaluation  Patient identified by MRN, date of birth, ID band Patient awake    Reviewed: Allergy & Precautions, H&P , NPO status , Patient's Chart, lab work & pertinent test results  Airway Mallampati: II   Neck ROM: full    Dental   Pulmonary asthma , COPD, former smoker,    breath sounds clear to auscultation       Cardiovascular hypertension, + CAD   Rhythm:regular Rate:Normal     Neuro/Psych PSYCHIATRIC DISORDERS Anxiety Depression    GI/Hepatic hiatal hernia, (+) Hepatitis -, C  Endo/Other  Hypothyroidism   Renal/GU      Musculoskeletal  (+) Arthritis ,   Abdominal   Peds  Hematology   Anesthesia Other Findings   Reproductive/Obstetrics                             Anesthesia Physical Anesthesia Plan  ASA: III  Anesthesia Plan: General   Post-op Pain Management:  Regional for Post-op pain   Induction: Intravenous  PONV Risk Score and Plan: 3 and Ondansetron, Dexamethasone and Treatment may vary due to age or medical condition  Airway Management Planned: Oral ETT  Additional Equipment:   Intra-op Plan:   Post-operative Plan:   Informed Consent:   Plan Discussed with:   Anesthesia Plan Comments:         Anesthesia Quick Evaluation

## 2019-06-05 NOTE — Transfer of Care (Signed)
Immediate Anesthesia Transfer of Care Note  Patient: Kirsten Patrick  Procedure(s) Performed: Left shoulder arthroscopy, subacromial decompression,rotator cuff repair (Left Shoulder)  Patient Location: PACU  Anesthesia Type:General  Level of Consciousness: sedated  Airway & Oxygen Therapy: Patient Spontanous Breathing and Patient connected to face mask oxygen  Post-op Assessment: Report given to RN and Post -op Vital signs reviewed and stable  Post vital signs: Reviewed and stable  Last Vitals:  Vitals Value Taken Time  BP    Temp    Pulse 73 06/05/19 1450  Resp 12 06/05/19 1450  SpO2 99 % 06/05/19 1450  Vitals shown include unvalidated device data.  Last Pain:  Vitals:   06/05/19 1223  TempSrc:   PainSc: 0-No pain         Complications: No apparent anesthesia complications

## 2019-06-05 NOTE — H&P (Signed)
Kirsten Patrick    Chief Complaint: Left shoulder impingement acromioclavicular osteoarthritis and partial rotator cuff tear HPI: The patient is a 68 y.o. female with chronic left shoulder pain with impingement syndrome and symptomatic AC joint arthritis and associated functional mutations that has been refractory to prolonged attempts at conservative management.  Preoperative MRI scan does show a partial rotator cuff tear.  Due to ongoing pain and functional rotations patient is brought to the operating this time for planned left shoulder arthroscopy  Past Medical History:  Diagnosis Date  . Anxiety   . Asthma   . Carotid artery stenosis    Right  . COPD (chronic obstructive pulmonary disease) (HCC)    Mild  . Coronary artery disease   . DDD (degenerative disc disease), cervical   . Depression   . Heart murmur   . Hepatitis C    chronic, viral 2015  . History of blood transfusion 1973  . History of hiatal hernia   . History of sleep apnea    No longer uses CPAP machine  . History of vertigo   . Hyperlipidemia   . Hypertension   . Hypothyroidism   . Low vitamin D level   . Osteoporosis   . Pneumonia 2014  . Pre-diabetes   . SUI (stress urinary incontinence, female)    History of  . Vitamin B deficiency   . Wears dentures    upper  . Wears glasses    Reading  . Wears partial dentures    Lower    Past Surgical History:  Procedure Laterality Date  . ABDOMINAL HYSTERECTOMY    . ANKLE FRACTURE SURGERY Right   . APPENDECTOMY    . CAROTID ENDARTERECTOMY Right 09/2018   Dr. Wenda Overland  . CATARACT EXTRACTION Right   . CERVICAL FUSION    . CHOLECYSTECTOMY    . COLONOSCOPY    . FOOT SURGERY Left   . HERNIA REPAIR    . INCONTINENCE SURGERY    . LAPAROSCOPIC GASTRIC BANDING    . TONSILLECTOMY    . UPPER GI ENDOSCOPY    . UPPER GI ENDOSCOPY    . WISDOM TOOTH EXTRACTION      History reviewed. No pertinent family history.  Social History:  reports that she quit smoking  about 4 weeks ago. Her smoking use included cigarettes. She has a 8.75 pack-year smoking history. She has never used smokeless tobacco. She reports that she does not drink alcohol or use drugs.   Medications Prior to Admission  Medication Sig Dispense Refill  . acetaminophen (TYLENOL) 500 MG tablet Take 1,000 mg by mouth every 6 (six) hours as needed for moderate pain or headache.    Marland Kitchen amLODipine (NORVASC) 5 MG tablet Take 5 mg by mouth at bedtime.     Marland Kitchen atorvastatin (LIPITOR) 40 MG tablet Take 40 mg by mouth at bedtime.    Marland Kitchen buPROPion (WELLBUTRIN SR) 150 MG 12 hr tablet Take 150 mg by mouth at bedtime.     . Cyanocobalamin (B-12 PO) Take 1 capsule by mouth at bedtime.    . DULoxetine (CYMBALTA) 60 MG capsule Take 60 mg by mouth at bedtime.     Marland Kitchen levothyroxine (SYNTHROID, LEVOTHROID) 175 MCG tablet Take 175 mcg by mouth at bedtime.     Marland Kitchen losartan-hydrochlorothiazide (HYZAAR) 100-12.5 MG tablet Take 1 tablet by mouth at bedtime.     Marland Kitchen albuterol (VENTOLIN HFA) 108 (90 Base) MCG/ACT inhaler Inhale 2 puffs into the lungs every 6 (six) hours  as needed for wheezing or shortness of breath.    . Cholecalciferol (VITAMIN D3 PO) Take 1 capsule by mouth at bedtime.    Marland Kitchen omeprazole (PRILOSEC) 20 MG capsule Take 20 mg by mouth daily as needed (acid reflux).       Physical Exam: Left shoulder demonstrates painful and guarded motion with a markedly positive impingement sign and additional clinical findings as noted at her recent office visits.  Preoperative MRI scan confirms a significant partial rotator cuff tear as well as AC joint arthritis and significant bony impingement.  Vitals  Temp:  [98.2 F (36.8 C)] 98.2 F (36.8 C) (12/01 1058) Pulse Rate:  [67] 67 (12/01 1058) Resp:  [16] 16 (12/01 1058) BP: (155)/(75) 155/75 (12/01 1058) SpO2:  [100 %] 100 % (12/01 1058) Weight:  [83.9 kg-85.7 kg] 85.7 kg (12/01 1058)  Assessment/Plan  Impression: Left shoulder impingement acromioclavicular  osteoarthritis and partial rotator cuff tear  Plan of Action: Procedure(s): Left shoulder arthroscopy, subacromial decompression, distal clavicle resection, and possible rotator cuff repair  Damieon Armendariz M Raianna Slight 06/05/2019, 12:05 PM Contact # [phone]

## 2019-06-05 NOTE — Discharge Instructions (Signed)
   Kevin M. Supple, M.D., F.A.A.O.S. [facility] Specializing in Arthroscopic and Reconstructive Surgery of the Shoulder [phone] [address] - [place], [place] [address] - Fax [phone]  POST-OP SHOULDER ARTHROSCOPIC ROTATOR CUFF AND/OR LABRAL REPAIR INSTRUCTIONS  1. Call the office at [phone] to schedule your first post-op appointment 7-10 days from the date of your surgery.  2. Leave the steri-strips in place over your incisions when performing dressing changes and showering. You may remove your dressings and begin showering 72 hours from surgery. You can expect drainage that is clear to bloody in nature that occasionally will soak through your dressings. If this occurs go ahead and perform a dressing change. The drainage should lessen daily and when there is no drainage from your incisions feel free to go without a dressing.  3. Wear your sling/immobilizer at all times except to perform the exercises below or to occasionally let your arm dangle by your side to stretch your elbow. You also need to sleep in your sling immobilizer until instructed otherwise.  4. Range of motion to your elbow, wrist, and hand are encouraged 3-5 times daily. Exercise to your hand and fingers helps to reduce swelling you may experience.  5. Utilize ice to the shoulder 3-4 times minimum a day and additionally if you are experiencing pain.  6. You may one-armed drive when safely off of narcotics and muscle relaxants. You may use your hand that is in the sling to support the steering wheel only. However, should it be your right arm that is in the sling it is not to be used for gear shifting in a manual transmission.  7. Pain control following an exparel block  To help control your post-operative pain you received a nerve block  performed with Exparel which is a long acting anesthetic (numbing agent) which can provide pain relief and sensations of numbness (and relief of pain) in the  operative shoulder and arm for up to 3 days. Sometimes it provides mixed relief, meaning you may still have numbness in certain areas of the arm but can still be able to move  parts of that arm, hand, and fingers. We recommend that your prescribed pain medications  be used as needed. We do not feel it is necessary to "pre medicate" and "stay ahead" of pain.  Taking narcotic pain medications when you are not having any pain can lead to unnecessary and potentially dangerous side effects.    8. Pain medications can produce constipation along with their use. If you experience this, the use of an over the counter stool softener or laxative daily is recommended.   9. For additional questions or concerns, please do not hesitate to call the office. If after hours there is an answering service to forward your concerns to the physician on call.  POST-OP EXERCISES  Pendulum Exercises  Perform pendulum exercises while standing and bending at the waist. Support your uninvolved arm on a table or chair and allow your operated arm to hang freely. Make sure to do these exercises passively - not using you shoulder muscle.  Repeat 20 times. Do 3 sessions per day.  

## 2019-06-11 ENCOUNTER — Encounter (HOSPITAL_COMMUNITY): Payer: Self-pay | Admitting: Orthopedic Surgery

## 2019-06-13 ENCOUNTER — Encounter (HOSPITAL_COMMUNITY): Payer: Self-pay | Admitting: Orthopedic Surgery

## 2020-06-02 IMAGING — DX DG CHEST 2V
2 series · 2 of 2 positions shown · non-contrast
Comparison: None.

CLINICAL DATA: Right leg swelling.  Smoker.

EXAM:
CHEST - 2 VIEW

[chest pa]
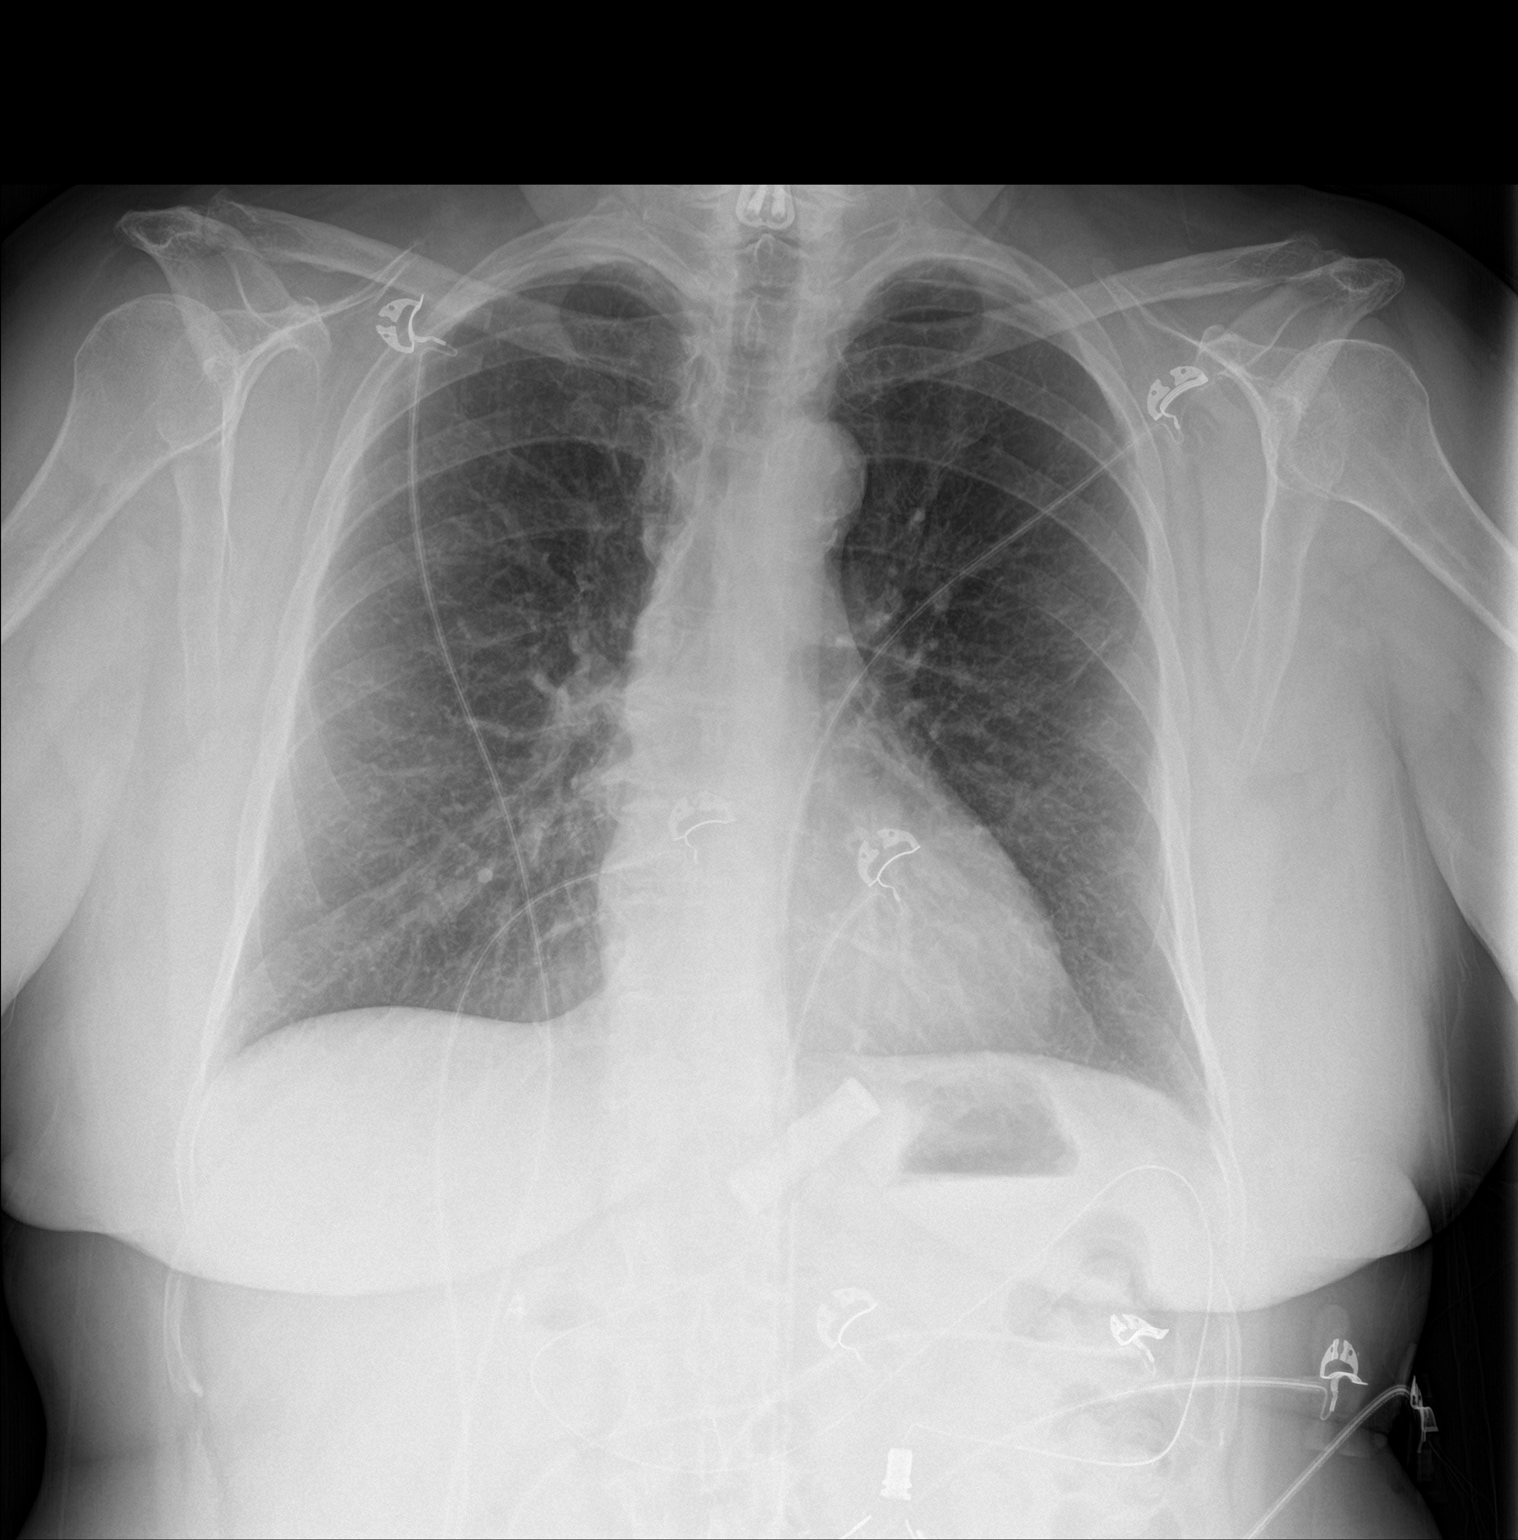

[chest lat]
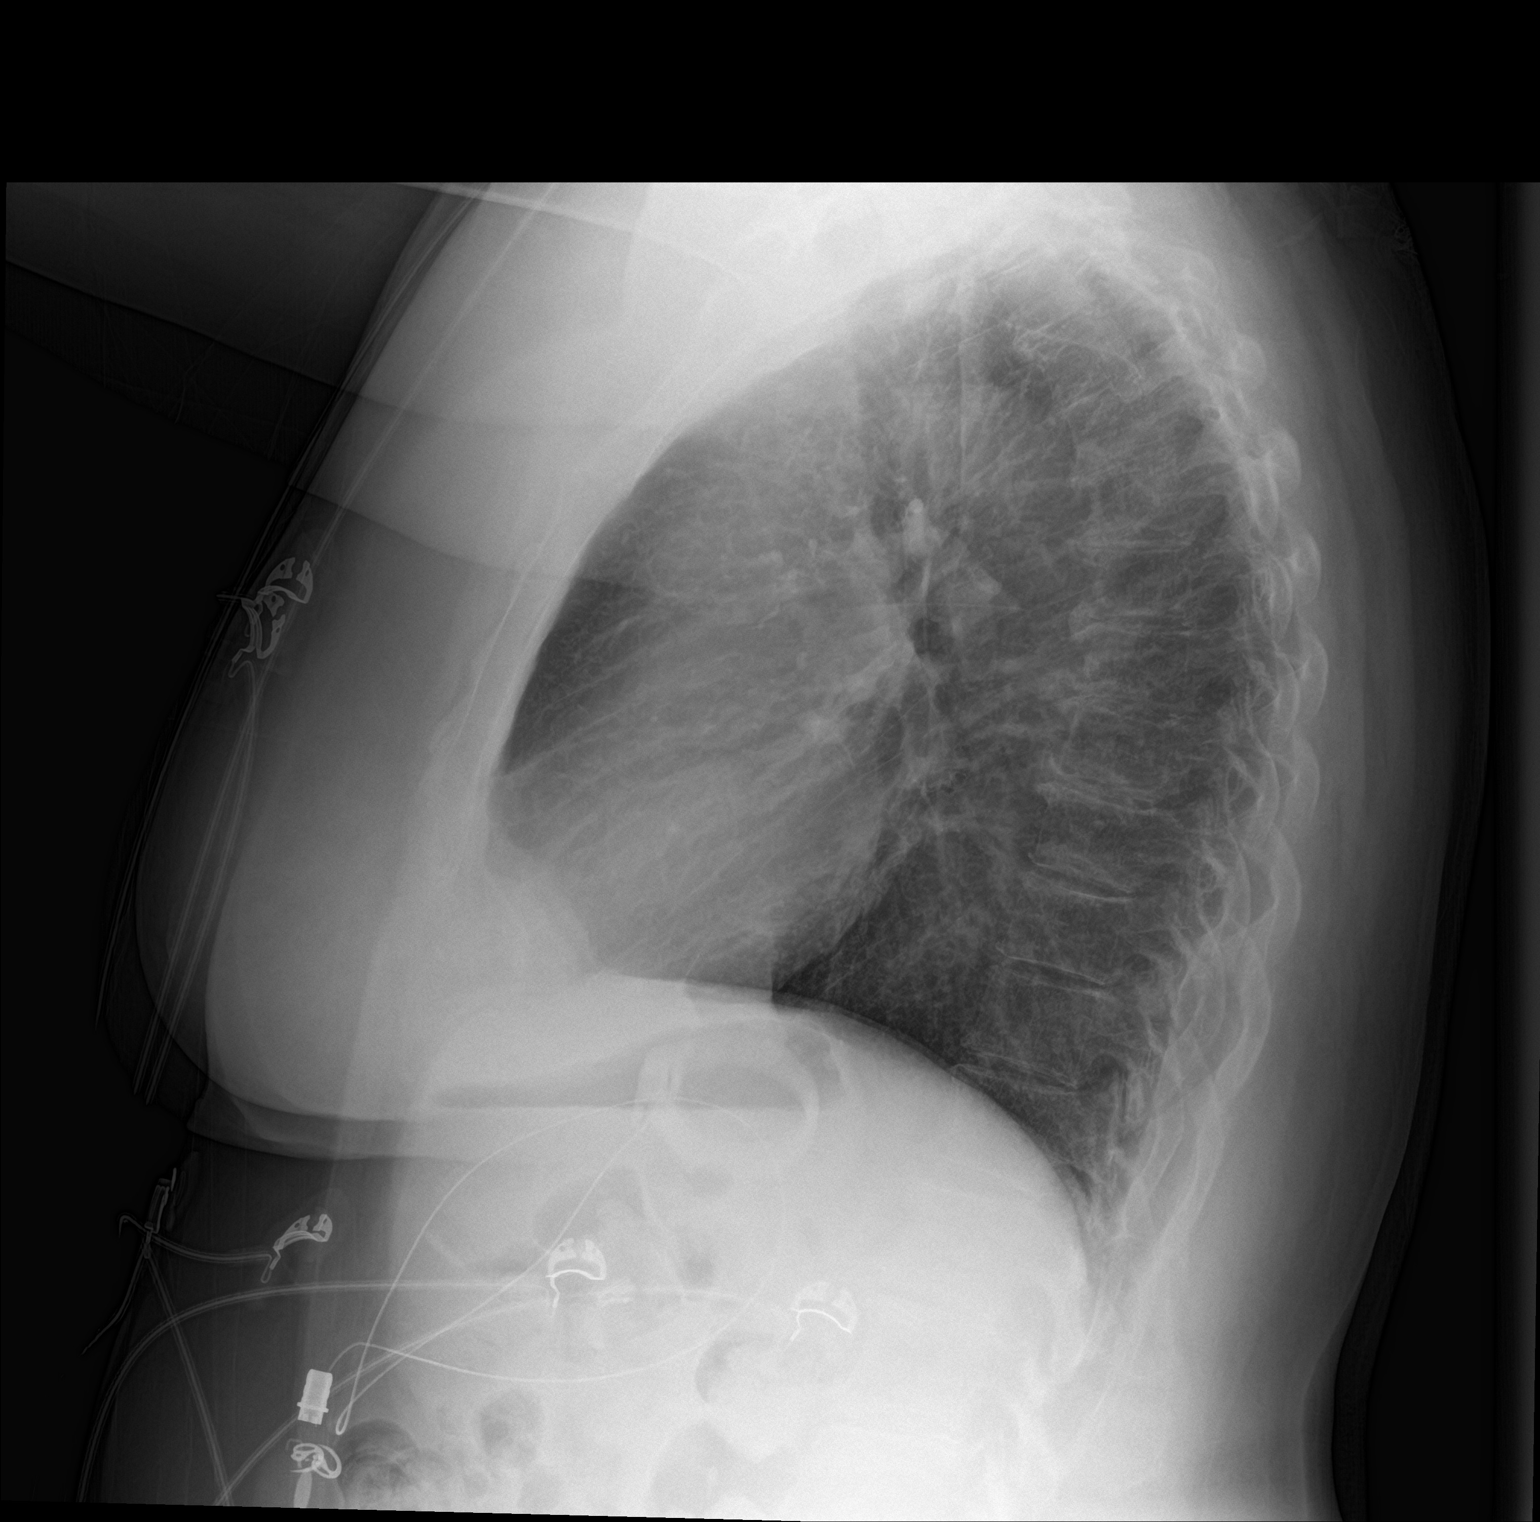

[2 of 2 positions shown; findings below may reference images not displayed]

FINDINGS: Normal sized heart. Clear lungs. The lungs are mildly hyperexpanded
with mild diffuse peribronchial thickening and accentuation of the
interstitial markings. A gastric band is in place. Cervical spine
fixation hardware. Right neck surgical clips. Thoracic spine
degenerative changes and mild scoliosis.
IMPRESSION: No acute abnormality.  Mild changes of COPD and chronic bronchitis.

## 2023-02-11 ENCOUNTER — Other Ambulatory Visit (HOSPITAL_BASED_OUTPATIENT_CLINIC_OR_DEPARTMENT_OTHER): Payer: Self-pay
